# Patient Record
Sex: Male | Born: 1955 | Race: White | Hispanic: No | Marital: Married | Smoking: Never smoker
Health system: Southern US, Community
[De-identification: ages and names within clinical notes are randomized; demographics above are authoritative.]

## PROBLEM LIST (undated history)

## (undated) DIAGNOSIS — K219 Gastro-esophageal reflux disease without esophagitis: Secondary | ICD-10-CM

## (undated) DIAGNOSIS — I10 Essential (primary) hypertension: Secondary | ICD-10-CM

---

## 2019-08-03 ENCOUNTER — Emergency Department: Payer: BLUE CROSS/BLUE SHIELD

## 2019-08-03 ENCOUNTER — Encounter: Payer: Self-pay | Admitting: Emergency Medicine

## 2019-08-03 ENCOUNTER — Other Ambulatory Visit: Payer: Self-pay

## 2019-08-03 ENCOUNTER — Emergency Department
Admission: EM | Admit: 2019-08-03 | Discharge: 2019-08-03 | Disposition: A | Discharge status: Home / Self Care | Payer: BLUE CROSS/BLUE SHIELD | Attending: Emergency Medicine | Admitting: Emergency Medicine

## 2019-08-03 DIAGNOSIS — Y92410 Unspecified street and highway as the place of occurrence of the external cause: Secondary | ICD-10-CM | POA: Insufficient documentation

## 2019-08-03 DIAGNOSIS — Y9389 Activity, other specified: Secondary | ICD-10-CM | POA: Diagnosis not present

## 2019-08-03 DIAGNOSIS — Y999 Unspecified external cause status: Secondary | ICD-10-CM | POA: Insufficient documentation

## 2019-08-03 DIAGNOSIS — M7918 Myalgia, other site: Secondary | ICD-10-CM | POA: Diagnosis not present

## 2019-08-03 DIAGNOSIS — S169XXA Unspecified injury of muscle, fascia and tendon at neck level, initial encounter: Secondary | ICD-10-CM | POA: Insufficient documentation

## 2019-08-03 DIAGNOSIS — I10 Essential (primary) hypertension: Secondary | ICD-10-CM | POA: Diagnosis not present

## 2019-08-03 DIAGNOSIS — Z79899 Other long term (current) drug therapy: Secondary | ICD-10-CM | POA: Diagnosis not present

## 2019-08-03 DIAGNOSIS — S161XXA Strain of muscle, fascia and tendon at neck level, initial encounter: Secondary | ICD-10-CM

## 2019-08-03 DIAGNOSIS — S199XXA Unspecified injury of neck, initial encounter: Secondary | ICD-10-CM | POA: Diagnosis present

## 2019-08-03 HISTORY — DX: Essential (primary) hypertension: I10

## 2019-08-03 HISTORY — DX: Gastro-esophageal reflux disease without esophagitis: K21.9

## 2019-08-03 MED ORDER — IBUPROFEN 600 MG PO TABS
600.0000 mg | ORAL_TABLET | Freq: Once | ORAL | Status: AC
  Administered 2019-08-03: 600 mg via ORAL
  Filled 2019-08-03: qty 1

## 2019-08-03 MED ORDER — IBUPROFEN 600 MG PO TABS: 600.0000 mg | ORAL_TABLET | Freq: Three times a day (TID) | ORAL | 0 refills | Status: AC | PRN

## 2019-08-03 MED ORDER — OXYCODONE-ACETAMINOPHEN 5-325 MG PO TABS
1.0000 | ORAL_TABLET | Freq: Once | ORAL | Status: DC
  Filled 2019-08-03: qty 1

## 2019-08-03 MED ORDER — CYCLOBENZAPRINE HCL 10 MG PO TABS: 10.0000 mg | ORAL_TABLET | Freq: Three times a day (TID) | ORAL | 0 refills | Status: AC | PRN
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
## 2019-08-03 NOTE — ED Notes (Signed)
Pt is back from x-ray and CT scan  Awaiting results

## 2019-08-03 NOTE — Discharge Instructions (Signed)
Follow discharge care instruction take medication as directed. °
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
